# Patient Record
Sex: Female | Born: 1955 | Race: White | Hispanic: No | Marital: Married | Smoking: Never smoker
Health system: Southern US, Community
[De-identification: ages and names within clinical notes are randomized; demographics above are authoritative.]

## PROBLEM LIST (undated history)

## (undated) DIAGNOSIS — G43909 Migraine, unspecified, not intractable, without status migrainosus: Secondary | ICD-10-CM

## (undated) HISTORY — PX: ABDOMINAL HYSTERECTOMY: SHX81

## (undated) HISTORY — PX: APPENDECTOMY: SHX54

---

## 2014-01-13 ENCOUNTER — Encounter (HOSPITAL_COMMUNITY): Payer: Self-pay | Admitting: Emergency Medicine

## 2014-01-13 ENCOUNTER — Emergency Department (HOSPITAL_COMMUNITY)
Admission: EM | Admit: 2014-01-13 | Discharge: 2014-01-13 | Disposition: A | Discharge status: Home / Self Care | Payer: Managed Care, Other (non HMO) | Attending: Emergency Medicine | Admitting: Emergency Medicine

## 2014-01-13 ENCOUNTER — Emergency Department (HOSPITAL_COMMUNITY): Payer: Managed Care, Other (non HMO)

## 2014-01-13 DIAGNOSIS — X58XXXA Exposure to other specified factors, initial encounter: Secondary | ICD-10-CM | POA: Insufficient documentation

## 2014-01-13 DIAGNOSIS — Z79899 Other long term (current) drug therapy: Secondary | ICD-10-CM | POA: Insufficient documentation

## 2014-01-13 DIAGNOSIS — Y939 Activity, unspecified: Secondary | ICD-10-CM | POA: Insufficient documentation

## 2014-01-13 DIAGNOSIS — G454 Transient global amnesia: Secondary | ICD-10-CM

## 2014-01-13 DIAGNOSIS — Z880 Allergy status to penicillin: Secondary | ICD-10-CM | POA: Insufficient documentation

## 2014-01-13 DIAGNOSIS — Z791 Long term (current) use of non-steroidal anti-inflammatories (NSAID): Secondary | ICD-10-CM | POA: Insufficient documentation

## 2014-01-13 DIAGNOSIS — S39012A Strain of muscle, fascia and tendon of lower back, initial encounter: Secondary | ICD-10-CM

## 2014-01-13 DIAGNOSIS — S335XXA Sprain of ligaments of lumbar spine, initial encounter: Secondary | ICD-10-CM | POA: Insufficient documentation

## 2014-01-13 DIAGNOSIS — Y929 Unspecified place or not applicable: Secondary | ICD-10-CM | POA: Insufficient documentation

## 2014-01-13 DIAGNOSIS — Z7982 Long term (current) use of aspirin: Secondary | ICD-10-CM | POA: Insufficient documentation

## 2014-01-13 LAB — COMPREHENSIVE METABOLIC PANEL
ALT: 20 U/L (ref 0–35)
AST: 20 U/L (ref 0–37)
Albumin: 4.3 g/dL (ref 3.5–5.2)
Alkaline Phosphatase: 69 U/L (ref 39–117)
Anion gap: 15 (ref 5–15)
BUN: 11 mg/dL (ref 6–23)
CALCIUM: 9.4 mg/dL (ref 8.4–10.5)
CO2: 24 mEq/L (ref 19–32)
Chloride: 102 mEq/L (ref 96–112)
Creatinine, Ser: 0.64 mg/dL (ref 0.50–1.10)
GFR calc Af Amer: >90 mL/min (ref >90)
GFR calc non Af Amer: >90 mL/min (ref >90)
Glucose, Bld: 97 mg/dL (ref 70–99)
Potassium: 3.8 mEq/L (ref 3.7–5.3)
SODIUM: 141 meq/L (ref 137–147)
TOTAL PROTEIN: 7.6 g/dL (ref 6.0–8.3)
Total Bilirubin: 0.5 mg/dL (ref 0.3–1.2)

## 2014-01-13 LAB — RAPID URINE DRUG SCREEN, HOSP PERFORMED
Amphetamines: NOT DETECTED
Barbiturates: NOT DETECTED
Benzodiazepines: NOT DETECTED
COCAINE: NOT DETECTED
Opiates: NOT DETECTED
Tetrahydrocannabinol: NOT DETECTED

## 2014-01-13 LAB — DIFFERENTIAL
BASOS ABS: 0 10*3/uL (ref 0.0–0.1)
Basophils Relative: 0 % (ref 0–1)
EOS ABS: 0 10*3/uL (ref 0.0–0.7)
EOS PCT: 1 % (ref 0–5)
LYMPHS PCT: 20 % (ref 12–46)
Lymphs Abs: 1.8 10*3/uL (ref 0.7–4.0)
Monocytes Absolute: 0.7 10*3/uL (ref 0.1–1.0)
Monocytes Relative: 8 % (ref 3–12)
Neutro Abs: 6.2 10*3/uL (ref 1.7–7.7)
Neutrophils Relative %: 71 % (ref 43–77)

## 2014-01-13 LAB — URINALYSIS, ROUTINE W REFLEX MICROSCOPIC
BILIRUBIN URINE: NEGATIVE
GLUCOSE, UA: NEGATIVE mg/dL
Hgb urine dipstick: NEGATIVE
KETONES UR: NEGATIVE mg/dL
LEUKOCYTES UA: NEGATIVE
Nitrite: NEGATIVE
PH: 7.5 (ref 5.0–8.0)
Protein, ur: NEGATIVE mg/dL
Specific Gravity, Urine: 1.009 (ref 1.005–1.030)
Urobilinogen, UA: 0.2 mg/dL (ref 0.0–1.0)

## 2014-01-13 LAB — I-STAT TROPONIN, ED: Troponin i, poc: 0 ng/mL (ref 0.00–0.08)

## 2014-01-13 LAB — CBC
HCT: 40.2 % (ref 36.0–46.0)
Hemoglobin: 13.8 g/dL (ref 12.0–15.0)
MCH: 31.2 pg (ref 26.0–34.0)
MCHC: 34.3 g/dL (ref 30.0–36.0)
MCV: 90.7 fL (ref 78.0–100.0)
PLATELETS: 253 10*3/uL (ref 150–400)
RBC: 4.43 MIL/uL (ref 3.87–5.11)
RDW: 13.2 % (ref 11.5–15.5)
WBC: 8.8 10*3/uL (ref 4.0–10.5)

## 2014-01-13 LAB — ETHANOL

## 2014-01-13 MED ORDER — METHOCARBAMOL 500 MG PO TABS: 500.0000 mg | ORAL_TABLET | Freq: Two times a day (BID) | ORAL | Status: AC

## 2014-01-13 MED ORDER — MORPHINE SULFATE 4 MG/ML IJ SOLN
4.0000 mg | INTRAMUSCULAR | Status: DC | PRN
  Administered 2014-01-13: 4 mg via INTRAVENOUS
  Filled 2014-01-13: qty 1

## 2014-01-13 MED ORDER — HYDROCODONE-ACETAMINOPHEN 5-325 MG PO TABS: 1.0000 | ORAL_TABLET | ORAL | Status: AC | PRN

## 2014-01-13 MED ORDER — PROMETHAZINE HCL 25 MG PO TABS: 25.0000 mg | ORAL_TABLET | Freq: Four times a day (QID) | ORAL | Status: AC | PRN

## 2014-01-13 MED ORDER — NAPROXEN 500 MG PO TABS: 500.0000 mg | ORAL_TABLET | Freq: Two times a day (BID) | ORAL | Status: AC

## 2014-01-13 MED ORDER — METHOCARBAMOL 1000 MG/10ML IJ SOLN
1000.0000 mg | Freq: Once | INTRAVENOUS | Status: AC
  Administered 2014-01-13: 1000 mg via INTRAVENOUS
  Filled 2014-01-13: qty 10

## 2014-01-13 MED ORDER — ONDANSETRON HCL 4 MG/2ML IJ SOLN
4.0000 mg | Freq: Once | INTRAMUSCULAR | Status: AC
  Administered 2014-01-13: 4 mg via INTRAVENOUS
  Filled 2014-01-13: qty 2

## 2014-01-13 MED ORDER — DIAZEPAM 5 MG/ML IJ SOLN
5.0000 mg | Freq: Once | INTRAMUSCULAR | Status: DC | PRN
Start: 2014-01-13 — End: 2014-01-13

## 2014-01-13 NOTE — Discharge Instructions (Signed)
Back Pain, Adult Back pain is very common. The pain often gets better over time. The cause of back pain is usually not dangerous. Most people can learn to manage their back pain on their own.  HOME CARE   Stay active. Start with short walks on flat ground if you can. Try to walk farther each day.  Do not sit, drive, or stand in one place for more than 30 minutes. Do not stay in bed.  Do not avoid exercise or work. Activity can help your back heal faster.  Be careful when you bend or lift an object. Bend at your knees, keep the object close to you, and do not twist.  Sleep on a firm mattress. Lie on your side, and bend your knees. If you lie on your back, put a pillow under your knees.  Only take medicines as told by your doctor.  Put ice on the injured area.  Put ice in a plastic bag.  Place a towel between your skin and the bag.  Leave the ice on for 15-20 minutes, 03-04 times a day for the first 2 to 3 days. After that, you can switch between ice and heat packs.  Ask your doctor about back exercises or massage.  Avoid feeling anxious or stressed. Find good ways to deal with stress, such as exercise. GET HELP RIGHT AWAY IF:   Your pain does not go away with rest or medicine.  Your pain does not go away in 1 week.  You have new problems.  You do not feel well.  The pain spreads into your legs.  You cannot control when you poop (bowel movement) or pee (urinate).  Your arms or legs feel weak or lose feeling (numbness).  You feel sick to your stomach (nauseous) or throw up (vomit).  You have belly (abdominal) pain.  You feel like you may pass out (faint). MAKE SURE YOU:   Understand these instructions.  Will watch your condition.  Will get help right away if you are not doing well or get worse. Document Released: 12/07/2007 Document Revised: 09/12/2011 Document Reviewed: 11/08/2010 [facility] Patient Information 2015 [place], [place]. This information is not intended  to replace advice given to you by your health care provider. Make sure you discuss any questions you have with your health care provider.  Lumbosacral Strain Lumbosacral strain is a strain of any of the parts that make up your lumbosacral vertebrae. Your lumbosacral vertebrae are the bones that make up the lower third of your backbone. Your lumbosacral vertebrae are held together by muscles and tough, fibrous tissue (ligaments).  CAUSES  A sudden blow to your back can cause lumbosacral strain. Also, anything that causes an excessive stretch of the muscles in the low back can cause this strain. This is typically seen when people exert themselves strenuously, fall, lift heavy objects, bend, or crouch repeatedly. RISK FACTORS  Physically demanding work.  Participation in pushing or pulling sports or sports that require a sudden twist of the back (tennis, golf, baseball).  Weight lifting.  Excessive lower back curvature.  Forward-tilted pelvis.  Weak back or abdominal muscles or both.  Tight hamstrings. SIGNS AND SYMPTOMS  Lumbosacral strain may cause pain in the area of your injury or pain that moves (radiates) down your leg.  DIAGNOSIS Your health care provider can often diagnose lumbosacral strain through a physical exam. In some cases, you may need tests such as X-ray exams.  TREATMENT  Treatment for your lower back injury depends  on many factors that your clinician will have to evaluate. However, most treatment will include the use of anti-inflammatory medicines. HOME CARE INSTRUCTIONS   Avoid hard physical activities (tennis, racquetball, waterskiing) if you are not in proper physical condition for it. This may aggravate or create problems.  If you have a back problem, avoid sports requiring sudden body movements. Swimming and walking are generally safer activities.  Maintain good posture.  Maintain a healthy weight.  For acute conditions, you may put ice on the injured  area.  Put ice in a plastic bag.  Place a towel between your skin and the bag.  Leave the ice on for 20 minutes, 2-3 times a day.  When the low back starts healing, stretching and strengthening exercises may be recommended. SEEK MEDICAL CARE IF:  Your back pain is getting worse.  You experience severe back pain not relieved with medicines. SEEK IMMEDIATE MEDICAL CARE IF:   You have numbness, tingling, weakness, or problems with the use of your arms or legs.  There is a change in bowel or bladder control.  You have increasing pain in any area of the body, including your belly (abdomen).  You notice shortness of breath, dizziness, or feel faint.  You feel sick to your stomach (nauseous), are throwing up (vomiting), or become sweaty.  You notice discoloration of your toes or legs, or your feet get very cold. MAKE SURE YOU:   Understand these instructions.  Will watch your condition.  Will get help right away if you are not doing well or get worse. Document Released: 03/30/2005 Document Revised: 06/25/2013 Document Reviewed: 02/06/2013 [facility] Patient Information 2015 [place], [place]. This information is not intended to replace advice given to you by your health care provider. Make sure you discuss any questions you have with your health care provider.  Transient Global Amnesia Your exam shows you may have a rare problem that causes temporary amnesia, an inability to remember what has happened in the past several hours or day. Transient global amnesia (TGA) means you cannot remember recent events, even though you may look and act normally. There are no physical problems in TGA; your vision, strength, coordination, and sensations are all normal. TGA occurs most often in older patients, and in patients with high blood pressure. The exact cause of TGA is not known, although it is thought to be due to vascular disease in your brain. There is usually a complete return to normal memory  capacity after an episode is over. About 20-30% of patients with TGA will have more than one episode, and some studies show a slight increased risk for stroke. Although no special treatment is needed, taking up to one adult aspirin daily reduces the risk of having a stroke. You should consider taking aspirin daily if you are not allergic to it. Medical evaluation may require specialized scans to check for stroke or other brain problems, an EEG (brain wave test), or blood tests. Avoid alcohol or any sedating medicines until you are completely recovered. Call your doctor right away if your memory is not fully recovered after 24 hours, or if you have any other serious problems including:  Severe headache, nausea, vomiting, fever, or other symptoms of an infection.  Weakness, numbness, difficulty with movement, or incoordination.  Blurred or double vision, unusual sleepiness, seizures, or fainting. Document Released: 07/28/2004 Document Revised: 09/12/2011 Document Reviewed: 06/20/2005 [facility] Patient Information 2015 [place], [place]. This information is not intended to replace advice given to you by your health  care provider. Make sure you discuss any questions you have with your health care provider.

## 2014-01-13 NOTE — ED Notes (Signed)
Ambulated to bathroom with minimal assistance.

## 2014-01-13 NOTE — ED Provider Notes (Signed)
CSN: [id number]     Arrival date & time 01/13/14  1133 History   First MD Initiated Contact with Patient 01/13/14 1238     Chief Complaint  Patient presents with  . Back Pain  . Memory Loss      HPI  Patient presents after an episode of memory loss. She states her last reported her back has been bothering her period yesterday she went to the general of the bike. Upon returning home she has a significant lower back muscle spasms without leg symptoms. She presented to urgent care this morning. He was given Toradol IM. Started to feel nausea. Was discharged. Upon getting in the car started having difficulty with memory and cannot recall yesterday or morning this morning. Denies any traumatic events. Did not have vagal episode or significant difficulty with the IM injection. No stroke symptoms i.e. no weakness or numbness to the extremities. Is perseverating.  History reviewed. No pertinent past medical history. Past Surgical History  Procedure Laterality Date  . Abdominal hysterectomy    . Appendectomy     History reviewed. No pertinent family history. History  Substance Use Topics  . Smoking status: Never Smoker   . Smokeless tobacco: Not on file  . Alcohol Use: 0.6 oz/week    1 Glasses of wine per week     Comment: 1 glass a week   OB History   Grav Para Term Preterm Abortions TAB SAB Ect Mult Living                 Review of Systems  Constitutional: Negative for fever, chills, diaphoresis, appetite change and fatigue.  HENT: Negative for mouth sores, sore throat and trouble swallowing.   Eyes: Negative for visual disturbance.  Respiratory: Negative for cough, chest tightness, shortness of breath and wheezing.   Cardiovascular: Negative for chest pain.  Gastrointestinal: Negative for nausea, vomiting, abdominal pain, diarrhea and abdominal distention.  Endocrine: Negative for polydipsia, polyphagia and polyuria.  Genitourinary: Negative for dysuria, frequency and hematuria.   Musculoskeletal: Positive for back pain and myalgias. Negative for gait problem.  Skin: Negative for color change, pallor and rash.  Neurological: Negative for dizziness, syncope, light-headedness and headaches.       Amnesia for the morning events.  Hematological: Does not bruise/bleed easily.  Psychiatric/Behavioral: Negative for behavioral problems and confusion.      Allergies  Bactrim and Penicillins  Home Medications   Prior to Admission medications   Medication Sig Start Date End Date Taking? Authorizing Provider  aspirin 325 MG tablet Take 325 mg by mouth daily as needed for mild pain or headache.   Yes Historical Provider, MD  Estradiol (VAGIFEM) 10 MCG TABS vaginal tablet Place 1 tablet vaginally 3 (three) times a week.   Yes Historical Provider, MD  estradiol (VIVELLE-DOT) 0.025 MG/24HR Place 1 patch onto the skin 2 (two) times a week.  11/15/13  Yes Historical Provider, MD  naproxen sodium (ANAPROX) 220 MG tablet Take 440 mg by mouth daily as needed (for pain).   Yes Historical Provider, MD  HYDROcodone-acetaminophen (NORCO/VICODIN) 5-325 MG per tablet Take 1 tablet by mouth every 4 (four) hours as needed. 01/13/14   Rolland PorterMark Gustie Bobb, MD  methocarbamol (ROBAXIN) 500 MG tablet Take 1 tablet (500 mg total) by mouth 2 (two) times daily. 01/13/14   Rolland PorterMark Caspar Favila, MD  naproxen (NAPROSYN) 500 MG tablet Take 1 tablet (500 mg total) by mouth 2 (two) times daily. 01/13/14   Rolland PorterMark Eara Burruel, MD  promethazine (PHENERGAN) 25  MG tablet Take 1 tablet (25 mg total) by mouth every 6 (six) hours as needed for nausea. 01/13/14   Ankit Rhunette Croft, MD   BP 143/81  Pulse 68  Temp(Src) 98 F (36.7 C) (Oral)  Resp 12  Ht 5\' 6"  (1.676 m)  Wt 160 lb 4.8 oz (72.712 kg)  BMI 25.89 kg/m2  SpO2 98% Physical Exam  Constitutional: She is oriented to person, place, and time. She appears well-developed and well-nourished. No distress.  HENT:  Head: Normocephalic.  Eyes: Conjunctivae are normal. Pupils are equal,  round, and reactive to light. No scleral icterus.  Neck: Normal range of motion. Neck supple. No thyromegaly present.  Cardiovascular: Normal rate and regular rhythm.  Exam reveals no gallop and no friction rub.   No murmur heard. Pulmonary/Chest: Effort normal and breath sounds normal. No respiratory distress. She has no wheezes. She has no rales.  Abdominal: Soft. Bowel sounds are normal. She exhibits no distension. There is no tenderness. There is no rebound.  Musculoskeletal: Normal range of motion.  Tenderness in the musculature of the midline lower lumbar spine.  Neurological: She is alert and oriented to person, place, and time.  Amnestic for yesterday at the gym this morning at the urgent care center and for her transport here by husband. Does remember her name, address, her phone number, her birth date and place.  Normal symmetric Strength to shoulder shrug, triceps, biceps, grip,wrist flex/extend,and intrinsics  Norma lsymmetric sensation above and below clavicles, and to all distributions to UEs. Norma symmetric strength to flex/.extend hip and knees, dorsi/plantar flex ankles. Normal symmetric sensation to all distributions to LEs Patellar and achilles reflexes 1-2+. Downgoing Babinski   Skin: Skin is warm and dry. No rash noted.  Psychiatric: She has a normal mood and affect. Her behavior is normal.    ED Course  Procedures (including critical care time) Labs Review Labs Reviewed  CBC  DIFFERENTIAL  COMPREHENSIVE METABOLIC PANEL  ETHANOL  URINE RAPID DRUG SCREEN (HOSP PERFORMED)  URINALYSIS, ROUTINE W REFLEX MICROSCOPIC  CBG MONITORING, ED  I-STAT TROPOININ, ED    Imaging Review No results found.   EKG Interpretation   Date/Time:  Monday January 13 2014 12:17:14 EDT Ventricular Rate:  79 PR Interval:  154 QRS Duration: 68 QT Interval:  362 QTC Calculation: 415 R Axis:   76 Text Interpretation:  Normal sinus rhythm Septal infarct , age  undetermined Abnormal  ECG Confirmed by Rhunette Croft, MD, Janey Genta [id number]) on  01/13/2014 5:51:55 PM      MDM   Final diagnoses:  Transient global amnesia  Lumbar strain, initial encounter    Patient discussed with neurology. My working diagnosis is an episode of transient global amnesia. Unrelated to her Toradol. Unrelated to her back pain. Did not have an emotional event. On recheck she is starting to have some recall for memory. MRI is still pending. If her MRI is normal I think is appropriate for outpatient treatment. Neurological followup for primary care followup as needed. She is neurologically intact and without leg symptoms regarding her back pain. Plan will be symptomatic treatment with pain medicine, muscle relaxants, anti-inflammatories.    Rolland Porter, MD 01/20/14 [phone]

## 2014-01-13 NOTE — ED Notes (Signed)
Pt returned from MRI °

## 2014-01-13 NOTE — ED Provider Notes (Addendum)
  Physical Exam  BP 137/77  Pulse 73  Temp(Src) 98 F (36.7 C) (Oral)  Resp 15  Ht 5\' 6"  (1.676 m)  Wt 160 lb 4.8 oz (72.712 kg)  BMI 25.89 kg/m2  SpO2 100%  Physical Exam  ED Course  Procedures  MDM  Healthy patient comes in with back pain, and memory loss.  Pt with TIA. CC - back pain with amnesia today. No red flags of significant etiology for back pain. Neuro recs - MRI, if normal d/c.  Derwood KaplanAnkit Olena Willy, MD 01/13/14 1617  5:58 PM  MRI shows no acute findings. Results discussed with Dr. Amada JupiterKirkpatrick, who reviewed the MRI on his own. Has lumbar spine tenderness, with normal neuro exam and strength. Ambulating ok to the bathroom. Will discharge. Will email Dr. Darrell JewelPannosh to see is she can see patient sooner than September for likely a TIA workup.   Derwood KaplanAnkit Gennett Garcia, MD 01/13/14 [phone]

## 2014-01-13 NOTE — ED Notes (Signed)
Pt is no longer confused-- answers all questions appropriately. Remains unsure of events at urgent care.

## 2014-01-13 NOTE — ED Notes (Addendum)
[facility] No recent injury. Pt states was given Toradol IM at 1000 this morning and states now she can't remember anything since getting shot. Pt has clear speech, AO x 4, Neuro, NAD. Pt oriented to self, situation and place but  When asked month states "I just don't really know right now."  States "I can't remember anything that happened today or anything at all."

## 2014-01-16 ENCOUNTER — Telehealth: Payer: Self-pay | Admitting: Family Medicine

## 2014-01-16 NOTE — Telephone Encounter (Signed)
Called the pt to make appt.  She stated that she has found another physician to see who is closer to her home.  No longer needs an appt with [facility].  Wished her well and asked that she call back if there is a change of heart.  Will cancel her upcoming appt in Sept.

## 2014-01-16 NOTE — Telephone Encounter (Signed)
Message copied by Nils FlackADKINS, MISTY T on Thu Jan 16, 2014  2:15 PM ------      Message from: Derwood KaplanNANAVATI, ANKIT      Created: Mon Jan 13, 2014  6:09 PM       Hi Dr. Fabian SharpPanosh,            Patient Shawna Young has an appointment, new patient, set up with you for September. She is healthy, and had an amnesic event today. She has no risk factors for strokes. Her memoty has improved. MRI is normal, and neurology wants outpatient TIA workup.            Will you be either able to 1. See her sooner than her September date, or 2 order outpatient workup for her prior to her coming to see you.            Thanks.            Derwood KaplanAnkit Nanavati, MD      Emergency Medicine ------

## 2014-04-02 ENCOUNTER — Ambulatory Visit: Payer: Self-pay | Admitting: Internal Medicine

## 2014-10-24 ENCOUNTER — Encounter (HOSPITAL_COMMUNITY): Payer: Self-pay | Admitting: Emergency Medicine

## 2014-10-24 ENCOUNTER — Emergency Department (HOSPITAL_COMMUNITY)
Admission: EM | Admit: 2014-10-24 | Discharge: 2014-10-24 | Disposition: A | Discharge status: Home / Self Care | Payer: Managed Care, Other (non HMO) | Attending: Emergency Medicine | Admitting: Emergency Medicine

## 2014-10-24 ENCOUNTER — Emergency Department (HOSPITAL_COMMUNITY): Payer: Managed Care, Other (non HMO)

## 2014-10-24 DIAGNOSIS — Z880 Allergy status to penicillin: Secondary | ICD-10-CM | POA: Diagnosis not present

## 2014-10-24 DIAGNOSIS — R079 Chest pain, unspecified: Secondary | ICD-10-CM | POA: Diagnosis not present

## 2014-10-24 DIAGNOSIS — Z7952 Long term (current) use of systemic steroids: Secondary | ICD-10-CM | POA: Diagnosis not present

## 2014-10-24 DIAGNOSIS — Z79899 Other long term (current) drug therapy: Secondary | ICD-10-CM | POA: Insufficient documentation

## 2014-10-24 DIAGNOSIS — R0602 Shortness of breath: Secondary | ICD-10-CM | POA: Diagnosis not present

## 2014-10-24 LAB — CBC
HCT: 42 % (ref 36.0–46.0)
Hemoglobin: 14 g/dL (ref 12.0–15.0)
MCH: 30.9 pg (ref 26.0–34.0)
MCHC: 33.3 g/dL (ref 30.0–36.0)
MCV: 92.7 fL (ref 78.0–100.0)
PLATELETS: 182 10*3/uL (ref 150–400)
RBC: 4.53 MIL/uL (ref 3.87–5.11)
RDW: 13 % (ref 11.5–15.5)
WBC: 5.5 10*3/uL (ref 4.0–10.5)

## 2014-10-24 LAB — BASIC METABOLIC PANEL
Anion gap: 5 (ref 5–15)
BUN: 15 mg/dL (ref 6–23)
CALCIUM: 9.4 mg/dL (ref 8.4–10.5)
CO2: 25 mmol/L (ref 19–32)
Chloride: 108 mmol/L (ref 96–112)
Creatinine, Ser: 0.68 mg/dL (ref 0.50–1.10)
GFR calc Af Amer: >90 mL/min (ref >90)
GLUCOSE: 90 mg/dL (ref 70–99)
Potassium: 3.8 mmol/L (ref 3.5–5.1)
SODIUM: 138 mmol/L (ref 135–145)

## 2014-10-24 LAB — TROPONIN I: Troponin I: <0.03 ng/mL (ref <0.031)

## 2014-10-24 LAB — I-STAT TROPONIN, ED: Troponin i, poc: 0 ng/mL (ref 0.00–0.08)

## 2014-10-24 MED ORDER — FAMOTIDINE 20 MG PO TABS: 20.0000 mg | ORAL_TABLET | Freq: Two times a day (BID) | ORAL | Status: DC

## 2014-10-24 NOTE — ED Provider Notes (Signed)
CSN: [id number]     Arrival date & time 10/24/14  1219 History   First MD Initiated Contact with Patient 10/24/14 1349     Chief Complaint  Patient presents with  . Chest Pain  . Dizziness  . Numbness    right, facial     (Consider location/radiation/quality/duration/timing/severity/associated sxs/prior Treatment) HPI Comments: Patient presents with chest pain. She was at her hairdresser sitting in a chair and started having a achy discomfort in the center of her chest. It radiated to her jaw. Also radiates to her epigastrium. She had a little bit of nausea and a little bit of lightheadedness. There is no diaphoresis. She felt that she couldn't get a full breath but didn't have other shortness of breath. She felt like it was a little bit hard to swallow. She states the pain lasted about 2 hours. She did not feel like it was any worse when she was walking around or exerting herself. She denies any leg pain or swelling. She denies any cough or chest congestion. He's been pain-free since arrival to the ED. She denies any past history of chest pain or cardiac disease. She denies any history of hypertension hyperlipidemia diabetes or tobacco use. She denies any family history of heart disease. There is no history of recent long trips or surgeries. She denies any estrogen use. There is no history of blood clots.  Patient is a 59 y.o. female presenting with chest pain and dizziness.  Chest Pain Associated symptoms: dizziness and shortness of breath   Associated symptoms: no abdominal pain, no back pain, no cough, no diaphoresis, no fatigue, no fever, no headache, no nausea, no numbness, not vomiting and no weakness   Dizziness Associated symptoms: chest pain and shortness of breath   Associated symptoms: no blood in stool, no diarrhea, no headaches, no nausea, no vomiting and no weakness     History reviewed. No pertinent past medical history. Past Surgical History  Procedure Laterality Date  .  Abdominal hysterectomy    . Appendectomy     History reviewed. No pertinent family history. History  Substance Use Topics  . Smoking status: Never Smoker   . Smokeless tobacco: Not on file  . Alcohol Use: 0.6 oz/week    1 Glasses of wine per week     Comment: 1 glass a week   OB History    No data available     Review of Systems  Constitutional: Negative for fever, chills, diaphoresis and fatigue.  HENT: Negative for congestion, rhinorrhea and sneezing.   Eyes: Negative.   Respiratory: Positive for shortness of breath. Negative for cough and chest tightness.   Cardiovascular: Positive for chest pain. Negative for leg swelling.  Gastrointestinal: Negative for nausea, vomiting, abdominal pain, diarrhea and blood in stool.  Genitourinary: Negative for frequency, hematuria, flank pain and difficulty urinating.  Musculoskeletal: Negative for back pain and arthralgias.  Skin: Negative for rash.  Neurological: Positive for dizziness. Negative for speech difficulty, weakness, numbness and headaches.      Allergies  Toradol; Bactrim; and Penicillins  Home Medications   Prior to Admission medications   Medication Sig Start Date End Date Taking? Authorizing Provider  aspirin 325 MG tablet Take 325 mg by mouth daily as needed for mild pain or headache.   Yes Historical Provider, MD  buPROPion (WELLBUTRIN XL) 300 MG 24 hr tablet Take 300 mg by mouth every morning. 08/15/14 08/15/15 Yes Historical Provider, MD  cyclobenzaprine (FLEXERIL) 5 MG tablet Take 5 mg  by mouth 3 (three) times daily as needed. For migraines   Yes Historical Provider, MD  Estradiol (VAGIFEM) 10 MCG TABS vaginal tablet Place 1 tablet vaginally 2 (two) times a week.    Yes Historical Provider, MD  fluticasone (FLONASE) 50 MCG/ACT nasal spray Place 1 spray into the nose daily as needed. allergies 02/18/14 02/18/15 Yes Historical Provider, MD  naproxen (NAPROSYN) 500 MG tablet Take 1 tablet (500 mg total) by mouth 2 (two)  times daily. Patient taking differently: Take 500 mg by mouth 2 (two) times daily as needed for mild pain or moderate pain.  01/13/14  Yes Rolland Porter, MD  promethazine (PHENERGAN) 25 MG tablet Take 1 tablet (25 mg total) by mouth every 6 (six) hours as needed for nausea. 01/13/14  Yes Derwood Kaplan, MD  famotidine (PEPCID) 20 MG tablet Take 1 tablet (20 mg total) by mouth 2 (two) times daily. 10/24/14   Rolan Bucco, MD  HYDROcodone-acetaminophen (NORCO/VICODIN) 5-325 MG per tablet Take 1 tablet by mouth every 4 (four) hours as needed. Patient not taking: Reported on 10/24/2014 01/13/14   Rolland Porter, MD  methocarbamol (ROBAXIN) 500 MG tablet Take 1 tablet (500 mg total) by mouth 2 (two) times daily. Patient not taking: Reported on 10/24/2014 01/13/14   Rolland Porter, MD   BP 137/78 mmHg  Pulse 68  Temp(Src) 97.8 F (36.6 C) (Oral)  Resp 15  SpO2 100% Physical Exam  Constitutional: She is oriented to person, place, and time. She appears well-developed and well-nourished.  HENT:  Head: Normocephalic and atraumatic.  Eyes: Pupils are equal, round, and reactive to light.  Neck: Normal range of motion. Neck supple.  Cardiovascular: Normal rate, regular rhythm and normal heart sounds.   Pulmonary/Chest: Effort normal and breath sounds normal. No respiratory distress. She has no wheezes. She has no rales. She exhibits no tenderness.  Abdominal: Soft. Bowel sounds are normal. There is no tenderness. There is no rebound and no guarding.  Musculoskeletal: Normal range of motion. She exhibits no edema.  No edema or calf tenderness  Lymphadenopathy:    She has no cervical adenopathy.  Neurological: She is alert and oriented to person, place, and time.  Skin: Skin is warm and dry. No rash noted.  Psychiatric: She has a normal mood and affect.    ED Course  Procedures (including critical care time) Labs Review Labs Reviewed  CBC  BASIC METABOLIC PANEL  TROPONIN I  Rosezena Sensor, ED    Imaging  Review Dg Chest Port 1 View  10/24/2014   CLINICAL DATA:  Substernal chest pain.  Symptom onset earlier today.  EXAM: PORTABLE CHEST - 1 VIEW  COMPARISON:  None.  FINDINGS: The heart size and mediastinal contours are within normal limits. Both lungs are clear. The visualized skeletal structures are unremarkable.  IMPRESSION: No active disease.   Electronically Signed   By: Davonna Belling M.D.   On: 10/24/2014 13:44     EKG Interpretation   Date/Time:  Friday October 24 2014 14:28:02 EDT Ventricular Rate:  68 PR Interval:  173 QRS Duration: 71 QT Interval:  382 QTC Calculation: 406 R Axis:   64 Text Interpretation:  Sinus rhythm Probable anteroseptal infarct, old  since last tracing no significant change Confirmed by Dalin Caldera  MD, Athenia Rys  ([id number]) on 10/24/2014 2:34:36 PM      MDM   Final diagnoses:  Chest pain, unspecified chest pain type    Patient presents with chest discomfort with associated shortness of breath and dizziness.  She's been chest pain-free since arrival to the ED. Her EKG does not show any ischemic changes. Her troponin is negative. She has a heart score of 2. This is in the low risk category. I will check a second troponin. If this is negative, she can be discharged home with close follow-up with her primary care physician. She states that she does have a primary care physician and feels that she can follow-up on Monday or Tuesday. She is amenable to returning to the ED if she has any worsening symptoms over the weekend. I stressed that if she has any exertional symptoms she needs to return to the ED.  Dr. Freida BusmanAllen to d/c if 2nd troponin negative    Rolan BuccoMelanie Alichia Alridge, MD 10/24/14 [phone]

## 2014-10-24 NOTE — Discharge Instructions (Signed)

## 2014-10-24 NOTE — ED Notes (Signed)
Called lab regarding delay in troponin results. Lab sts "Troponin is running now."

## 2014-10-24 NOTE — ED Notes (Signed)
Pt c/o mid central chest pain radiating to right face (tingling), pain described as "pressure." Denies SOB/F/D. Endorses nausea. No hx CVA or MI. Has not started/ended any new medications. Denies hx anxiety. Denies family cardiac history and denies cholesterol problems. Pain started approximately 30 minutes ago when she was at hair salon.

## 2014-10-24 NOTE — ED Notes (Signed)
Bed: [id number] Expected date:  Expected time:  Means of arrival:  Comments: Hold- Triage 1

## 2014-10-24 NOTE — ED Provider Notes (Signed)
Repeat troponin negative. Pt to be d/c per instructions of dr. Jake Sharkbelfi  Shawna Rabinovich, MD 10/24/14 [phone]

## 2014-10-24 NOTE — Progress Notes (Signed)
Updated pcp in EPIC  Updated EDP that pt wanting results of labs

## 2014-10-24 NOTE — ED Notes (Signed)
Pt escorted to discharge window. Pt verbalized understanding discharge instructions. In no acute distress.  

## 2014-10-24 NOTE — ED Notes (Signed)
Nurse currently starting IV 

## 2014-12-03 ENCOUNTER — Emergency Department (HOSPITAL_COMMUNITY)
Admission: EM | Admit: 2014-12-03 | Discharge: 2014-12-03 | Disposition: A | Discharge status: Home / Self Care | Payer: Managed Care, Other (non HMO) | Attending: Emergency Medicine | Admitting: Emergency Medicine

## 2014-12-03 ENCOUNTER — Encounter (HOSPITAL_COMMUNITY): Payer: Self-pay | Admitting: Emergency Medicine

## 2014-12-03 DIAGNOSIS — R51 Headache: Secondary | ICD-10-CM

## 2014-12-03 DIAGNOSIS — G43909 Migraine, unspecified, not intractable, without status migrainosus: Secondary | ICD-10-CM | POA: Insufficient documentation

## 2014-12-03 DIAGNOSIS — Z880 Allergy status to penicillin: Secondary | ICD-10-CM | POA: Diagnosis not present

## 2014-12-03 DIAGNOSIS — Z7982 Long term (current) use of aspirin: Secondary | ICD-10-CM | POA: Diagnosis not present

## 2014-12-03 DIAGNOSIS — Z79899 Other long term (current) drug therapy: Secondary | ICD-10-CM | POA: Diagnosis not present

## 2014-12-03 DIAGNOSIS — Z7951 Long term (current) use of inhaled steroids: Secondary | ICD-10-CM | POA: Diagnosis not present

## 2014-12-03 DIAGNOSIS — R519 Headache, unspecified: Secondary | ICD-10-CM

## 2014-12-03 HISTORY — DX: Migraine, unspecified, not intractable, without status migrainosus: G43.909

## 2014-12-03 MED ORDER — DIPHENHYDRAMINE HCL 50 MG/ML IJ SOLN
25.0000 mg | Freq: Once | INTRAMUSCULAR | Status: AC
  Administered 2014-12-03: 25 mg via INTRAVENOUS
  Filled 2014-12-03: qty 1

## 2014-12-03 MED ORDER — DEXAMETHASONE SODIUM PHOSPHATE 4 MG/ML IJ SOLN
8.0000 mg | Freq: Once | INTRAMUSCULAR | Status: AC
  Administered 2014-12-03: 8 mg via INTRAVENOUS
  Filled 2014-12-03: qty 2

## 2014-12-03 MED ORDER — SODIUM CHLORIDE 0.9 % IV BOLUS (SEPSIS)
1000.0000 mL | Freq: Once | INTRAVENOUS | Status: AC
  Administered 2014-12-03: 1000 mL via INTRAVENOUS

## 2014-12-03 MED ORDER — SUMATRIPTAN SUCCINATE 50 MG PO TABS: 50.0000 mg | ORAL_TABLET | ORAL | Status: AC | PRN

## 2014-12-03 MED ORDER — PROCHLORPERAZINE EDISYLATE 5 MG/ML IJ SOLN
10.0000 mg | Freq: Once | INTRAMUSCULAR | Status: AC
  Administered 2014-12-03: 10 mg via INTRAVENOUS
  Filled 2014-12-03: qty 2

## 2014-12-03 NOTE — ED Provider Notes (Signed)
CSN: [id number]     Arrival date & time 12/03/14  4098 History   First MD Initiated Contact with Patient 12/03/14 [phone]     Chief Complaint  Patient presents with  . Migraine    hx of same, home meds not working     (Consider location/radiation/quality/duration/timing/severity/associated sxs/prior Treatment) HPI  58yF with headache. Hx of migraines and says this feels similar. Onset yesterday. Associated with n/v and photophobia which is typical. No fever or chills. Denies neck pain or stiffness. No trauma. No acute visual complaints. No significant relief with home meds.   Past Medical History  Diagnosis Date  . Migraine    Past Surgical History  Procedure Laterality Date  . Abdominal hysterectomy    . Appendectomy     No family history on file. History  Substance Use Topics  . Smoking status: Never Smoker   . Smokeless tobacco: Not on file  . Alcohol Use: 0.6 oz/week    1 Glasses of wine per week     Comment: 1 glass a week   OB History    No data available     Review of Systems  All systems reviewed and negative, other than as noted in HPI.   Allergies  Toradol; Bactrim; and Penicillins  Home Medications   Prior to Admission medications   Medication Sig Start Date End Date Taking? Authorizing Provider  aspirin 325 MG tablet Take 325 mg by mouth daily as needed for mild pain or headache.   Yes Historical Provider, MD  buPROPion (WELLBUTRIN XL) 300 MG 24 hr tablet Take 300 mg by mouth every morning. 08/15/14 08/15/15 Yes Historical Provider, MD  cyclobenzaprine (FLEXERIL) 5 MG tablet Take 5 mg by mouth 3 (three) times daily as needed. For migraines   Yes Historical Provider, MD  Estradiol (VAGIFEM) 10 MCG TABS vaginal tablet Place 1 tablet vaginally 2 (two) times a week.    Yes Historical Provider, MD  fluticasone (FLONASE) 50 MCG/ACT nasal spray Place 1 spray into the nose daily as needed. allergies 02/18/14 02/18/15 Yes Historical Provider, MD  Multiple  Vitamins-Minerals (MULTIVITAMIN GUMMIES ADULT PO) Take 1 each by mouth daily.   Yes Historical Provider, MD  ondansetron (ZOFRAN) 4 MG tablet Take 4 mg by mouth every 6 (six) hours as needed for nausea or vomiting.   Yes Historical Provider, MD  famotidine (PEPCID) 20 MG tablet Take 1 tablet (20 mg total) by mouth 2 (two) times daily. Patient not taking: Reported on 12/03/2014 10/24/14   Rolan Bucco, MD  HYDROcodone-acetaminophen (NORCO/VICODIN) 5-325 MG per tablet Take 1 tablet by mouth every 4 (four) hours as needed. Patient not taking: Reported on 10/24/2014 01/13/14   Rolland Porter, MD  methocarbamol (ROBAXIN) 500 MG tablet Take 1 tablet (500 mg total) by mouth 2 (two) times daily. Patient not taking: Reported on 10/24/2014 01/13/14   Rolland Porter, MD  naproxen (NAPROSYN) 500 MG tablet Take 1 tablet (500 mg total) by mouth 2 (two) times daily. Patient not taking: Reported on 12/03/2014 01/13/14   Rolland Porter, MD  promethazine (PHENERGAN) 25 MG tablet Take 1 tablet (25 mg total) by mouth every 6 (six) hours as needed for nausea. Patient not taking: Reported on 12/03/2014 01/13/14   Derwood Kaplan, MD   BP 121/82 mmHg  Pulse 75  Temp(Src) 98.2 F (36.8 C) (Oral)  SpO2 100% Physical Exam  Constitutional: She is oriented to person, place, and time. She appears well-developed and well-nourished. No distress.  HENT:  Head: Normocephalic and  atraumatic.  Eyes: Conjunctivae are normal. Right eye exhibits no discharge. Left eye exhibits no discharge.  Neck: Neck supple.  No nuchal rigidity  Cardiovascular: Normal rate, regular rhythm and normal heart sounds.  Exam reveals no gallop and no friction rub.   No murmur heard. Pulmonary/Chest: Effort normal and breath sounds normal. No respiratory distress.  Abdominal: Soft. She exhibits no distension. There is no tenderness.  Musculoskeletal: She exhibits no edema or tenderness.  Neurological: She is alert and oriented to person, place, and time. No cranial nerve  deficit. She exhibits normal muscle tone. Coordination normal.  Good finger to nose b/l  Skin: Skin is warm and dry.  Psychiatric: She has a normal mood and affect. Her behavior is normal. Thought content normal.  Nursing note and vitals reviewed.   ED Course  Procedures (including critical care time) Labs Review Labs Reviewed - No data to display  Imaging Review No results found.   EKG Interpretation None      MDM   Final diagnoses:  Acute nonintractable headache, unspecified headache type    58yF with headache. Suspect primary HA. Consider emergent secondary causes such as bleed, infectious or mass but doubt. There is no history of trauma. Pt has a nonfocal neurological exam. Afebrile and neck supple. No use of blood thinning medication. Consider ocular etiology such as acute angle closure glaucoma but doubt. Pt denies acute change in visual acuity and eye exam unremarkable. Doubt temporal arteritis. No temporal tenderness and temporal artery pulsations palpable. Doubt CO poisoning. No contacts with similar symptoms. Doubt venous thrombosis. Doubt carotid or vertebral arteries dissection. Symptoms improved with meds. Feel that can be safely discharged, but strict return precautions discussed. Outpt fu.     Raeford RazorStephen Tex Conroy, MD 12/11/14 [phone]

## 2014-12-03 NOTE — ED Notes (Signed)
Patient c/o migraine, reports hx of same. Tried home meds (Flexeril), not working.

## 2014-12-03 NOTE — Discharge Instructions (Signed)

## 2014-12-03 NOTE — ED Notes (Signed)
Pt c/o migraine that has been constant since yesterday morning. Pt is sensitive to light and has had n/v.

## 2015-07-03 ENCOUNTER — Emergency Department (HOSPITAL_COMMUNITY)
Admission: EM | Admit: 2015-07-03 | Discharge: 2015-07-03 | Disposition: A | Discharge status: Home / Self Care | Payer: Managed Care, Other (non HMO) | Attending: Emergency Medicine | Admitting: Emergency Medicine

## 2015-07-03 ENCOUNTER — Emergency Department (HOSPITAL_COMMUNITY): Payer: Managed Care, Other (non HMO)

## 2015-07-03 ENCOUNTER — Encounter (HOSPITAL_COMMUNITY): Payer: Self-pay | Admitting: Emergency Medicine

## 2015-07-03 DIAGNOSIS — G43909 Migraine, unspecified, not intractable, without status migrainosus: Secondary | ICD-10-CM | POA: Diagnosis not present

## 2015-07-03 DIAGNOSIS — Z79899 Other long term (current) drug therapy: Secondary | ICD-10-CM | POA: Insufficient documentation

## 2015-07-03 DIAGNOSIS — R42 Dizziness and giddiness: Secondary | ICD-10-CM | POA: Insufficient documentation

## 2015-07-03 DIAGNOSIS — R0789 Other chest pain: Secondary | ICD-10-CM | POA: Diagnosis not present

## 2015-07-03 DIAGNOSIS — Z880 Allergy status to penicillin: Secondary | ICD-10-CM | POA: Insufficient documentation

## 2015-07-03 DIAGNOSIS — R079 Chest pain, unspecified: Secondary | ICD-10-CM | POA: Diagnosis present

## 2015-07-03 LAB — I-STAT TROPONIN, ED
TROPONIN I, POC: 0 ng/mL (ref 0.00–0.08)
Troponin i, poc: 0 ng/mL (ref 0.00–0.08)

## 2015-07-03 LAB — CBC
HCT: 41.4 % (ref 36.0–46.0)
Hemoglobin: 13.8 g/dL (ref 12.0–15.0)
MCH: 30.7 pg (ref 26.0–34.0)
MCHC: 33.3 g/dL (ref 30.0–36.0)
MCV: 92 fL (ref 78.0–100.0)
Platelets: 300 10*3/uL (ref 150–400)
RBC: 4.5 MIL/uL (ref 3.87–5.11)
RDW: 13.2 % (ref 11.5–15.5)
WBC: 7 10*3/uL (ref 4.0–10.5)

## 2015-07-03 LAB — BASIC METABOLIC PANEL
ANION GAP: 9 (ref 5–15)
BUN: 19 mg/dL (ref 6–20)
CALCIUM: 9.8 mg/dL (ref 8.9–10.3)
CO2: 25 mmol/L (ref 22–32)
Chloride: 106 mmol/L (ref 101–111)
Creatinine, Ser: 0.77 mg/dL (ref 0.44–1.00)
Glucose, Bld: 97 mg/dL (ref 65–99)
Potassium: 3.7 mmol/L (ref 3.5–5.1)
Sodium: 140 mmol/L (ref 135–145)

## 2015-07-03 MED ORDER — FAMOTIDINE 20 MG PO TABS: 20.0000 mg | ORAL_TABLET | Freq: Two times a day (BID) | ORAL | Status: AC

## 2015-07-03 MED ORDER — ASPIRIN 81 MG PO CHEW
324.0000 mg | CHEWABLE_TABLET | Freq: Once | ORAL | Status: AC
  Administered 2015-07-03: 324 mg via ORAL
  Filled 2015-07-03: qty 4

## 2015-07-03 MED ORDER — GI COCKTAIL ~~LOC~~
30.0000 mL | Freq: Once | ORAL | Status: AC
  Administered 2015-07-03: 30 mL via ORAL
  Filled 2015-07-03: qty 30

## 2015-07-03 MED ORDER — NITROGLYCERIN 2 % TD OINT
1.0000 [in_us] | TOPICAL_OINTMENT | Freq: Once | TRANSDERMAL | Status: AC
  Administered 2015-07-03: 1 [in_us] via TOPICAL
  Filled 2015-07-03: qty 1

## 2015-07-03 NOTE — ED Provider Notes (Signed)
Patient seen/examined in the Emergency Department in conjunction with Midlevel Provider  Patient reports chest pain - reports it as sharp and denies any SOB.  No pleuritic pain.   Exam : awake/alert, no distress, no murmurs noted, lung are clear Plan: HEART score less than 3.  Will have repeat troponin at 530am I doubt PE/Dissection/ACS at this time    Zadie Rhineonald Mateen Franssen, MD 07/03/15 [phone]

## 2015-07-03 NOTE — ED Notes (Signed)
Patient presents with substernal chest pain, onset 45minutes PTA, non radiating, also c/o nausea and lightheadedness. Denies vomiting, diaphoresis. Describes pain as pressure. Rates pain 7/10.

## 2015-07-03 NOTE — ED Provider Notes (Signed)
CSN: [id number]     Arrival date & time 07/03/15  0138 History   First MD Initiated Contact with Patient 07/03/15 0148     CC: Chest pain  (Consider location/radiation/quality/duration/timing/severity/associated sxs/prior Treatment) HPI   Blood pressure 135/81, pulse 70, temperature 98.5 F (36.9 C), temperature source Oral, resp. rate 17, SpO2 100 %.  Shawna Young is a 59 y.o. female complaining of retrosternal pressure-like chest pain onset 1 hour ago when patient was sitting down watching TV associated with lightheadedness. Patient denies diaphoresis, nausea, vomiting, cough, fever, chills, exacerbation with movement, exertion, palpitation, history of diabetes, hypertension, hyperlipidemia, family history of ACS, tobacco use. Patient is status post hysterectomy, she is having hot flashes. Patient had a similar episode in May of this year states that she had a normal stress test at that time. Pt denies fever, cough, h/o DVT/PE, calf pain or leg swelling, hemoptysis, recent immobilization, cancer/chemotherapy in the last 6 months. States that she uses intravaginal estrogen.   Past Medical History  Diagnosis Date  . Migraine    Past Surgical History  Procedure Laterality Date  . Abdominal hysterectomy    . Appendectomy     No family history on file. Social History  Substance Use Topics  . Smoking status: Never Smoker   . Smokeless tobacco: None  . Alcohol Use: 0.6 oz/week    1 Glasses of wine per week     Comment: 1 glass a week   OB History    No data available     Review of Systems  10 systems reviewed and found to be negative, except as noted in the HPI.   Allergies  Toradol; Bactrim; and Penicillins  Home Medications   Prior to Admission medications   Medication Sig Start Date End Date Taking? Authorizing Provider  buPROPion (WELLBUTRIN XL) 300 MG 24 hr tablet Take 300 mg by mouth every morning. 08/15/14 08/15/15 Yes Historical Provider, MD  Estradiol (VAGIFEM) 10  MCG TABS vaginal tablet Place 1 tablet vaginally 2 (two) times a week.    Yes Historical Provider, MD  Multiple Vitamins-Minerals (MULTIVITAMIN GUMMIES ADULT PO) Take 1 each by mouth daily.   Yes Historical Provider, MD  SUMAtriptan (IMITREX) 50 MG tablet Take 1 tablet (50 mg total) by mouth every 2 (two) hours as needed for migraine. May repeat in 2 hours if headache persists or recurs. Do not exceed two doses per day. 12/03/14  Yes Raeford Razor, MD  famotidine (PEPCID) 20 MG tablet Take 1 tablet (20 mg total) by mouth 2 (two) times daily. Patient not taking: Reported on 12/03/2014 10/24/14   Rolan Bucco, MD  fluticasone [facility]) 50 MCG/ACT nasal spray Place 1 spray into the nose daily as needed. allergies 02/18/14 02/18/15  Historical Provider, MD  HYDROcodone-acetaminophen (NORCO/VICODIN) 5-325 MG per tablet Take 1 tablet by mouth every 4 (four) hours as needed. Patient not taking: Reported on 10/24/2014 01/13/14   Rolland Porter, MD  methocarbamol (ROBAXIN) 500 MG tablet Take 1 tablet (500 mg total) by mouth 2 (two) times daily. Patient not taking: Reported on 10/24/2014 01/13/14   Rolland Porter, MD  naproxen (NAPROSYN) 500 MG tablet Take 1 tablet (500 mg total) by mouth 2 (two) times daily. Patient not taking: Reported on 12/03/2014 01/13/14   Rolland Porter, MD  promethazine (PHENERGAN) 25 MG tablet Take 1 tablet (25 mg total) by mouth every 6 (six) hours as needed for nausea. Patient not taking: Reported on 12/03/2014 01/13/14   Derwood Kaplan, MD   BP 135/81  mmHg  Pulse 70  Temp(Src) 98.5 F (36.9 C) (Oral)  Resp 17  SpO2 100% Physical Exam  Constitutional: She is oriented to person, place, and time. She appears well-developed and well-nourished. No distress.  HENT:  Head: Normocephalic.  Mouth/Throat: Oropharynx is clear and moist.  Eyes: Conjunctivae are normal.  Neck: Normal range of motion. No JVD present. No tracheal deviation present.  Cardiovascular: Normal rate, regular rhythm and intact distal  pulses.   Radial pulse equal bilaterally  Pulmonary/Chest: Effort normal and breath sounds normal. No stridor. No respiratory distress. She has no wheezes. She has no rales. She exhibits no tenderness.  Abdominal: Soft. She exhibits no distension and no mass. There is no tenderness. There is no rebound and no guarding.  Musculoskeletal: Normal range of motion. She exhibits no edema or tenderness.  No calf asymmetry, superficial collaterals, palpable cords, edema, Homans sign negative bilaterally.    Neurological: She is alert and oriented to person, place, and time.  Skin: Skin is warm. She is not diaphoretic.  Psychiatric: She has a normal mood and affect.  Nursing note and vitals reviewed.   ED Course  Procedures (including critical care time) Labs Review Labs Reviewed  BASIC METABOLIC PANEL  CBC  I-STAT TROPOININ, ED    Imaging Review Dg Chest 2 View  07/03/2015  CLINICAL DATA:  59 year old female with sudden onset of chest pain EXAM: CHEST  2 VIEW COMPARISON:  Chest radiograph dated 10/24/2014 FINDINGS: The heart size and mediastinal contours are within normal limits. Both lungs are clear. The visualized skeletal structures are unremarkable. IMPRESSION: No active cardiopulmonary disease. Electronically Signed   By: Elgie CollardArash  Radparvar M.D.   On: 07/03/2015 02:17   I have personally reviewed and evaluated these images and lab results as part of my medical decision-making.   EKG Interpretation   Date/Time:  Friday July 03 2015 01:52:11 EST Ventricular Rate:  71 PR Interval:  163 QRS Duration: 85 QT Interval:  379 QTC Calculation: 412 R Axis:   73 Text Interpretation:  Sinus rhythm Non-specific ST-t changes No  significant change since last tracing Confirmed by Bebe ShaggyWICKLINE  MD, DONALD  [phone]([id number]) on 07/03/2015 1:58:03 AM      MDM   Final diagnoses:  Atypical chest pain    Filed Vitals:   07/03/15 0155 07/03/15 0241  BP: 135/81   Pulse: 70   Temp: 98.5 F (36.9 C)    TempSrc: Oral   Resp: 17   SpO2: 100% 100%    Medications  gi cocktail (Maalox,Lidocaine,Donnatal) (not administered)  aspirin chewable tablet 324 mg (324 mg Oral Given 07/03/15 0210)  nitroGLYCERIN (NITROGLYN) 2 % ointment 1 inch (1 inch Topical Given 07/03/15 0210)    Shawna Young is 59 y.o. female presenting with retrosternal nonradiating chest pain associated with lightheaded sensation while at rest. Patient is low risk by heart score. Had negative stress test within the last year. EKG unchanged from prior.  Trop negative.   Pain improved with GI cocktail, I think this may be reflux. Delta troponin is negative. Patient will follow with primary care physician, will start on Pepcid.  This is a shared visit with the attending physician who personally evaluated the patient and agrees with the care plan.    Evaluation does not show pathology that would require ongoing emergent intervention or inpatient treatment. Pt is hemodynamically stable and mentating appropriately. Discussed findings and plan with patient/guardian, who agrees with care plan. All questions answered. Return precautions discussed and outpatient follow up  given.   New Prescriptions   FAMOTIDINE (PEPCID) 20 MG TABLET    Take 1 tablet (20 mg total) by mouth 2 (two) times daily.         Wynetta Emery, PA-C 07/03/15 1610  Zadie Rhine, MD 07/03/15 [phone]

## 2015-07-03 NOTE — ED Notes (Signed)
EKG given to EDP,Wickline,MD., for review. 

## 2015-07-03 NOTE — Discharge Instructions (Signed)
Please follow with your primary care doctor in the next 2 days for a check-up. They must obtain records for further management.  ° °Do not hesitate to return to the Emergency Department for any new, worsening or concerning symptoms.  ° °

## 2020-10-10 ENCOUNTER — Other Ambulatory Visit: Payer: Self-pay

## 2020-10-10 ENCOUNTER — Emergency Department (HOSPITAL_COMMUNITY)
Admission: EM | Admit: 2020-10-10 | Discharge: 2020-10-10 | Disposition: A | Discharge status: Home / Self Care | Payer: Medicare Other | Attending: Emergency Medicine | Admitting: Emergency Medicine

## 2020-10-10 ENCOUNTER — Encounter (HOSPITAL_COMMUNITY): Payer: Self-pay

## 2020-10-10 ENCOUNTER — Emergency Department (HOSPITAL_COMMUNITY): Payer: Medicare Other

## 2020-10-10 DIAGNOSIS — M79661 Pain in right lower leg: Secondary | ICD-10-CM | POA: Insufficient documentation

## 2020-10-10 DIAGNOSIS — R0602 Shortness of breath: Secondary | ICD-10-CM | POA: Diagnosis not present

## 2020-10-10 DIAGNOSIS — R0789 Other chest pain: Secondary | ICD-10-CM

## 2020-10-10 DIAGNOSIS — R079 Chest pain, unspecified: Secondary | ICD-10-CM | POA: Insufficient documentation

## 2020-10-10 LAB — COMPREHENSIVE METABOLIC PANEL
ALT: 22 U/L (ref 0–44)
AST: 26 U/L (ref 15–41)
Albumin: 4.4 g/dL (ref 3.5–5.0)
Alkaline Phosphatase: 72 U/L (ref 38–126)
Anion gap: 6 (ref 5–15)
BUN: 15 mg/dL (ref 8–23)
CO2: 25 mmol/L (ref 22–32)
Calcium: 9.9 mg/dL (ref 8.9–10.3)
Chloride: 107 mmol/L (ref 98–111)
Creatinine, Ser: 0.77 mg/dL (ref 0.44–1.00)
GFR, Estimated: >60 mL/min (ref >60)
Glucose, Bld: 100 mg/dL — ABNORMAL HIGH (ref 70–99)
Potassium: 3.7 mmol/L (ref 3.5–5.1)
Sodium: 138 mmol/L (ref 135–145)
Total Bilirubin: 0.7 mg/dL (ref 0.3–1.2)
Total Protein: 7.2 g/dL (ref 6.5–8.1)

## 2020-10-10 LAB — CBC WITH DIFFERENTIAL/PLATELET
Abs Immature Granulocytes: 0.02 10*3/uL (ref 0.00–0.07)
Basophils Absolute: 0.1 10*3/uL (ref 0.0–0.1)
Basophils Relative: 1 %
Eosinophils Absolute: 0.2 10*3/uL (ref 0.0–0.5)
Eosinophils Relative: 3 %
HCT: 43.3 % (ref 36.0–46.0)
Hemoglobin: 14.5 g/dL (ref 12.0–15.0)
Immature Granulocytes: 0 %
Lymphocytes Relative: 36 %
Lymphs Abs: 3 10*3/uL (ref 0.7–4.0)
MCH: 30.7 pg (ref 26.0–34.0)
MCHC: 33.5 g/dL (ref 30.0–36.0)
MCV: 91.7 fL (ref 80.0–100.0)
Monocytes Absolute: 0.8 10*3/uL (ref 0.1–1.0)
Monocytes Relative: 10 %
Neutro Abs: 4.3 10*3/uL (ref 1.7–7.7)
Neutrophils Relative %: 50 %
Platelets: 315 10*3/uL (ref 150–400)
RBC: 4.72 MIL/uL (ref 3.87–5.11)
RDW: 13.2 % (ref 11.5–15.5)
WBC: 8.4 10*3/uL (ref 4.0–10.5)
nRBC: 0 % (ref 0.0–0.2)

## 2020-10-10 LAB — TROPONIN I (HIGH SENSITIVITY)
Troponin I (High Sensitivity): 3 ng/L (ref <18)
Troponin I (High Sensitivity): 3 ng/L (ref <18)

## 2020-10-10 MED ORDER — IOHEXOL 350 MG/ML SOLN
75.0000 mL | Freq: Once | INTRAVENOUS | Status: AC | PRN
  Administered 2020-10-10: 75 mL via INTRAVENOUS

## 2020-10-10 NOTE — ED Notes (Signed)
Returned from CT.

## 2020-10-10 NOTE — ED Notes (Signed)
ED Provider at bedside. 

## 2020-10-10 NOTE — ED Notes (Signed)
I spoke with lab regarding troponin that was sent at 1610, lab states is spinning now

## 2020-10-10 NOTE — ED Provider Notes (Signed)
I assumed care of patient at shift change from previous team, please see their note for full H&P. Briefly patient developed sharp chest pain radiating into the left jaw and between shoulder blades and recently had a prolonged trip to [place]. Physical Exam  BP 124/80   Pulse 66   Temp 97.7 F (36.5 C) (Oral)   Resp 18   Ht 5\' 6"  (1.676 m)   Wt 77.1 kg   SpO2 99%   BMI 27.44 kg/m   Physical Exam Patient is awake and alert, lying in bed in no obvious distress.  No tenderness to palpation of the chest wall or back.  2+ radial pulses present bilaterally.  No posterior calf pain or tenderness bilaterally on my exam.  Respirations are even and unlabored.  ED Course/Procedures     Procedures   Labs Reviewed  COMPREHENSIVE METABOLIC PANEL - Abnormal; Notable for the following components:      Result Value   Glucose, Bld 100 (*)    All other components within normal limits  CBC WITH DIFFERENTIAL/PLATELET  TROPONIN I (HIGH SENSITIVITY)  TROPONIN I (HIGH SENSITIVITY)    CT Angio Chest PE W and/or Wo Contrast  Result Date: 10/10/2020 CLINICAL DATA:  Acute onset pleuritic chest pain and shortness of breath. EXAM: CT ANGIOGRAPHY CHEST WITH CONTRAST TECHNIQUE: Multidetector CT imaging of the chest was performed using the standard protocol during bolus administration of intravenous contrast. Multiplanar CT image reconstructions and MIPs were obtained to evaluate the vascular anatomy. CONTRAST:  57mL OMNIPAQUE IOHEXOL 350 MG/ML SOLN COMPARISON:  Chest x-ray dated July 03, 2015. FINDINGS: Cardiovascular: Satisfactory opacification of the pulmonary arteries to the segmental level. No evidence of pulmonary embolism. Normal heart size. No pericardial effusion. No thoracic aortic aneurysm or dissection. Mediastinum/Nodes: No enlarged mediastinal, hilar, or axillary lymph nodes. Thyroid gland, trachea, and esophagus demonstrate no significant findings. Lungs/Pleura: No focal consolidation, pleural  effusion, or pneumothorax. No suspicious pulmonary nodule. Upper Abdomen: No acute abnormality. Proximal stenosis of the celiac artery. Musculoskeletal: No chest wall abnormality. No acute or significant osseous findings. Review of the MIP images confirms the above findings. IMPRESSION: 1. No evidence of pulmonary embolism. No acute intrathoracic process. 2. 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 Electronically Signed   By: July 05, 2015 M.D.   On: 10/10/2020 17:07     MDM  Plan is to follow-up on troponin, CT PE study, if normal anticipate discharge home.  Overall low suspicion for DVT.  CTA PE study does not show PE or other cause for patient's symptoms. Her troponin x2 is negative. Both myself and Dr. 12/10/2020 had a discussion with patient regarding admission for ongoing chest pain for further evaluation versus discharge home.  Through shared decision making patient will discharge home.  She is instructed to take aspirin 81 mg daily.  She is given cardiology follow-up and strict return precautions.  Return precautions were discussed with patient who states their understanding.  At the time of discharge patient denied any unaddressed complaints or concerns.  Patient is agreeable for discharge home.  Note: Portions of this report may have been transcribed using voice recognition software. Every effort was made to ensure accuracy; however, inadvertent computerized transcription errors may be present        Silverio Lay 10/10/20 2338    2339, MD 10/10/20 [phone]

## 2020-10-10 NOTE — ED Provider Notes (Signed)
[facility] EMERGENCY DEPARTMENT Provider Note   CSN: [id number] Arrival date & time: 10/10/20  1346     History Chief Complaint  Patient presents with  . Shortness of Breath  . Chest Pain    Started one hour ago, sent here from urgent care, cp sharp radiates to left jaw and between shoulder blades, nausea and sob    Shawna Young is a 65 y.o. female.  Patient presents the emergency department for evaluation of chest pain, left-sided, worse with deep breathing and sharp in nature, and shortness of breath.  Approximately 2 hours ago patient was putting away dishes and developed acute pain in this area.  She went to an urgent care who did an EKG which was reportedly unremarkable.  She was sent to the emergency department due to concern for pulmonary embolism.  Patient recently drove to and from [place], stopping once on the way.  She reports soreness in her right anterior lower leg that she noted during urgent care exam.  Patient denies history of any heart problems.  No risk factors including hypertension, high cholesterol, diabetes.  She does not use tobacco.  No abdominal pain.  No nausea or vomiting.  Patient denies risk factors for pulmonary embolism including: unilateral leg swelling, history of DVT/PE/other blood clots, use of exogenous hormones, recent immobilizations, recent surgery, recent travel (>4hr segment), malignancy, hemoptysis.          Past Medical History:  Diagnosis Date  . Migraine     There are no problems to display for this patient.   Past Surgical History:  Procedure Laterality Date  . ABDOMINAL HYSTERECTOMY    . APPENDECTOMY       OB History    Gravida  2   Para  2   Term      Preterm      AB      Living        SAB      IAB      Ectopic      Multiple      Live Births              No family history on file.  Social History   Tobacco Use  . Smoking status: Never Smoker  . Smokeless tobacco: Never Used   Substance Use Topics  . Drug use: No    Home Medications Prior to Admission medications   Medication Sig Start Date End Date Taking? Authorizing Provider  buPROPion (WELLBUTRIN XL) 300 MG 24 hr tablet Take 300 mg by mouth every morning. 08/15/14 08/15/15  [provider]  Estradiol (VAGIFEM) 10 MCG TABS vaginal tablet Place 1 tablet vaginally 2 (two) times a week.     [provider]  famotidine (PEPCID) 20 MG tablet Take 1 tablet (20 mg total) by mouth 2 (two) times daily. 07/03/15   Pisciotta, Joni Reining, PA-C  fluticasone (FLONASE) 50 MCG/ACT nasal spray Place 1 spray into the nose daily as needed. allergies 02/18/14 02/18/15  [provider]  HYDROcodone-acetaminophen (NORCO/VICODIN) 5-325 MG per tablet Take 1 tablet by mouth every 4 (four) hours as needed. Patient not taking: Reported on 10/24/2014 01/13/14   Rolland Porter, MD  methocarbamol (ROBAXIN) 500 MG tablet Take 1 tablet (500 mg total) by mouth 2 (two) times daily. Patient not taking: Reported on 10/24/2014 01/13/14   Rolland Porter, MD  Multiple Vitamins-Minerals (MULTIVITAMIN GUMMIES ADULT PO) Take 1 each by mouth daily.    [provider]  naproxen (NAPROSYN) 500 MG tablet Take 1 tablet (500 mg total) by mouth 2 (two) times daily. Patient not taking: Reported on 12/03/2014 01/13/14   Rolland Porter, MD  promethazine (PHENERGAN) 25 MG tablet Take 1 tablet (25 mg total) by mouth every 6 (six) hours as needed for nausea. Patient not taking: Reported on 12/03/2014 01/13/14   Derwood Kaplan, MD  SUMAtriptan (IMITREX) 50 MG tablet Take 1 tablet (50 mg total) by mouth every 2 (two) hours as needed for migraine. May repeat in 2 hours if headache persists or recurs. Do not exceed two doses per day. 12/03/14   Raeford Razor, MD    Allergies    Toradol [ketorolac tromethamine], Bactrim [sulfamethoxazole-trimethoprim], and Penicillins  Review of Systems   Review of Systems  Constitutional: Negative for diaphoresis and  fever.  Eyes: Negative for redness.  Respiratory: Positive for shortness of breath. Negative for cough.   Cardiovascular: Positive for chest pain. Negative for palpitations and leg swelling.  Gastrointestinal: Positive for nausea. Negative for abdominal pain and vomiting.  Genitourinary: Negative for dysuria.  Musculoskeletal: Positive for back pain. Negative for neck pain.  Skin: Negative for rash.  Neurological: Negative for syncope and light-headedness.  Psychiatric/Behavioral: The patient is not nervous/anxious.     Physical Exam Updated Vital Signs BP (!) 128/91 (BP Location: Right Arm)   Pulse 75 Comment: NSR  Temp 98.3 F (36.8 C) (Oral)   Resp 18   Ht 5\' 6"  (1.676 m)   Wt 77.1 kg   SpO2 100%   BMI 27.44 kg/m   Physical Exam Vitals and nursing note reviewed.  Constitutional:      Appearance: She is well-developed. She is not diaphoretic.  HENT:     Head: Normocephalic and atraumatic.     Mouth/Throat:     Mouth: Mucous membranes are not dry.  Eyes:     Conjunctiva/sclera: Conjunctivae normal.  Neck:     Vascular: Normal carotid pulses. No carotid bruit or JVD.     Trachea: Trachea normal. No tracheal deviation.  Cardiovascular:     Rate and Rhythm: Normal rate and regular rhythm.     Pulses: No decreased pulses.     Heart sounds: Normal heart sounds, S1 normal and S2 normal. No murmur heard.     Comments: 2+ radial pulses bilaterally Pulmonary:     Effort: Pulmonary effort is normal. No respiratory distress.     Breath sounds: No wheezing.     Comments: Good air movement Chest:     Chest wall: No tenderness.  Abdominal:     General: Bowel sounds are normal.     Palpations: Abdomen is soft.     Tenderness: There is no abdominal tenderness. There is no guarding or rebound.  Musculoskeletal:        General: Normal range of motion.     Cervical back: Normal range of motion and neck supple. No muscular tenderness.     Right lower leg: Tenderness (anterior  shin, no skin change) present. No edema.     Left lower leg: No tenderness. No edema.  Skin:    General: Skin is warm and dry.     Coloration: Skin is not pale.  Neurological:     Mental Status: She is alert.     ED Results / Procedures / Treatments   Labs (all labs ordered are listed, but only abnormal results are displayed) Labs Reviewed  CBC WITH DIFFERENTIAL/PLATELET  COMPREHENSIVE METABOLIC PANEL  TROPONIN I (HIGH SENSITIVITY)  ED ECG REPORT   Date: 10/10/2020  Rate: 76  Rhythm: normal sinus rhythm  QRS Axis: normal  Intervals: normal  ST/T Wave abnormalities: normal  Conduction Disutrbances:none  Narrative Interpretation:   Old EKG Reviewed: unchanged  I have personally reviewed the EKG tracing and agree with the computerized printout as noted.  Radiology No results found.  Procedures Procedures   Medications Ordered in ED Medications - No data to display  ED Course  I have reviewed the triage vital signs and the nursing notes.  Pertinent labs & imaging results that were available during my care of the patient were reviewed by me and considered in my medical decision making (see chart for details).  Patient seen and examined. Work-up initiated. She declines pain medication. Will obtain CT given nature of pain and recent travel history. EKG reviewed.   Vital signs reviewed and are as follows: BP (!) 128/91 (BP Location: Right Arm)   Pulse 75 Comment: NSR  Temp 98.3 F (36.8 C) (Oral)   Resp 18   Ht 5\' 6"  (1.676 m)   Wt 77.1 kg   SpO2 100%   BMI 27.44 kg/m   2:59 PM Signout to at shift change.     MDM Rules/Calculators/A&P                          Pending CP eval, r/o PE.   Final Clinical Impression(s) / ED Diagnoses Final diagnoses:  None    Rx / DC Orders ED Discharge Orders    None       [employer], PA-C 10/10/20 1500    12/10/20, MD 10/11/20 [phone]

## 2020-10-10 NOTE — ED Triage Notes (Signed)
Started one hour ago, sent from urgent care, see addl triage notes

## 2020-10-10 NOTE — Discharge Instructions (Signed)
If your pain changes, worsens, or you develop any other new or concerning symptoms please seek additional medical care and evaluation. Please schedule a follow-up appointment with both your primary care doctor and cardiology.

## 2020-10-10 NOTE — ED Notes (Signed)
Pt discharged and ambulated out of the ED without difficulty. 

## 2021-06-13 IMAGING — CT CT ANGIO CHEST
2 of 7 series · 19 of 46 positions shown · IV contrast (omnipaque)
Comparison: Chest x-ray dated July 03, 2015.

CLINICAL DATA: Acute onset pleuritic chest pain and shortness of
breath.

EXAM:
CT ANGIOGRAPHY CHEST WITH CONTRAST
TECHNIQUE: Multidetector CT imaging of the chest was performed using the
standard protocol during bolus administration of intravenous
contrast. Multiplanar CT image reconstructions and MIPs were
obtained to evaluate the vascular anatomy.
CONTRAST:  75mL OMNIPAQUE IOHEXOL 350 MG/ML SOLN

[Series 6: thins · axial · 0.68mm/px · z∈[+969,+1237]mm · 16 of 431 slices shown]
[im 24/431  lung]
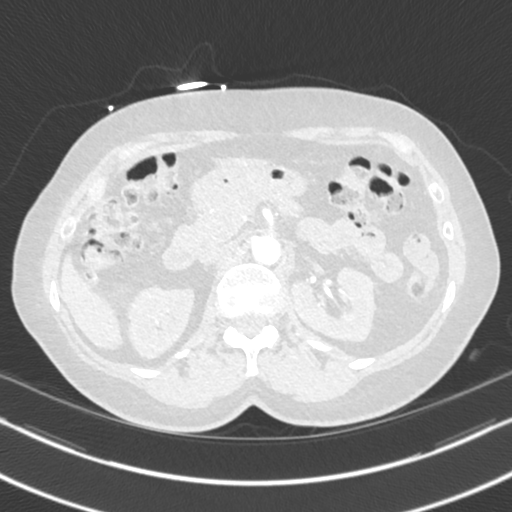
[im 48/431  soft-tissue]
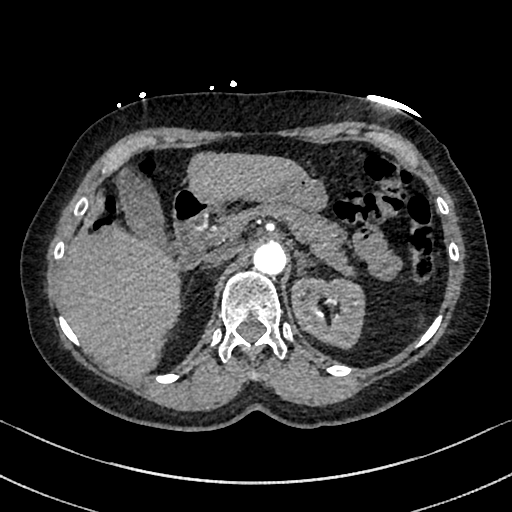
[im 72/431  lung]
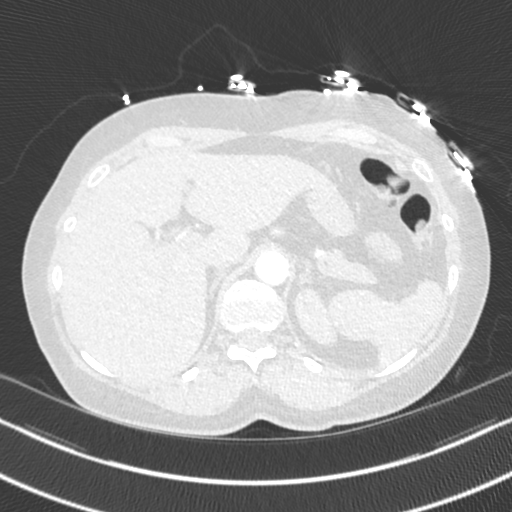
[im 96/431  soft-tissue]
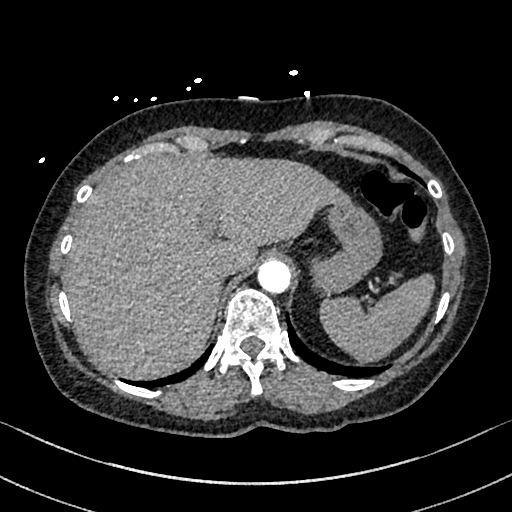
[im 120/431  lung]
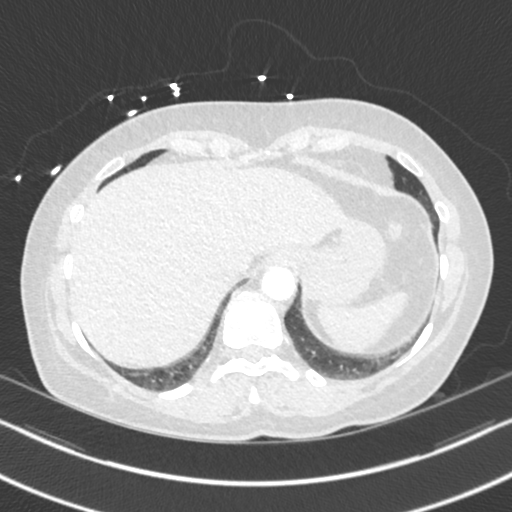
[im 144/431  soft-tissue]
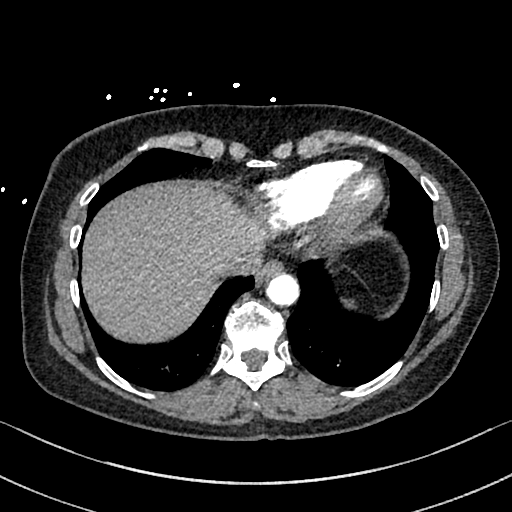
[im 168/431  lung]
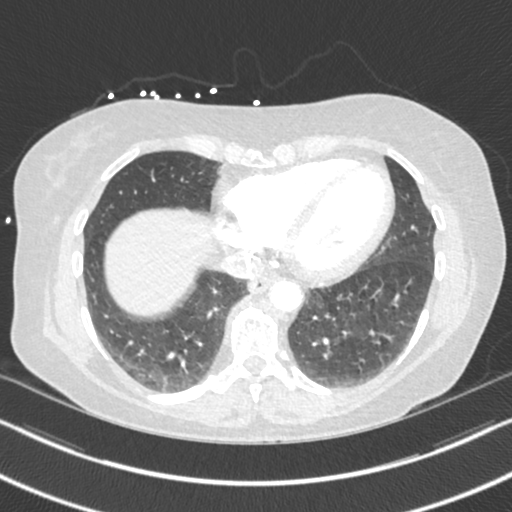
[im 192/431  soft-tissue]
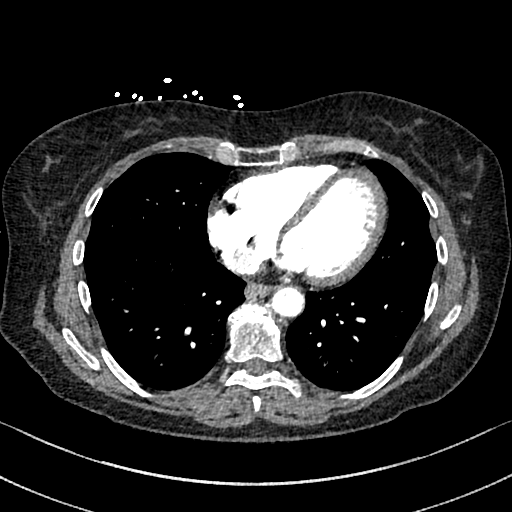
[im 239/431  lung]
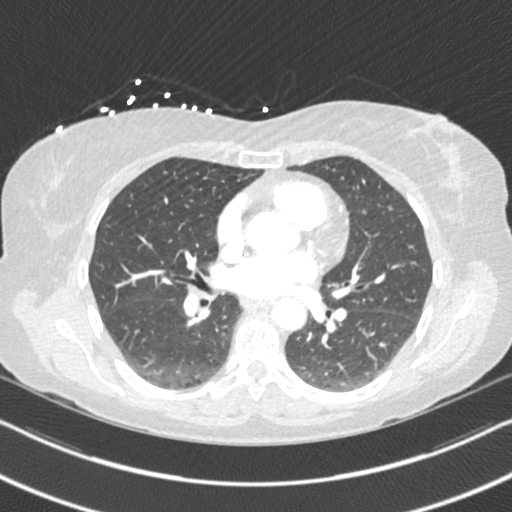
[im 263/431  soft-tissue]
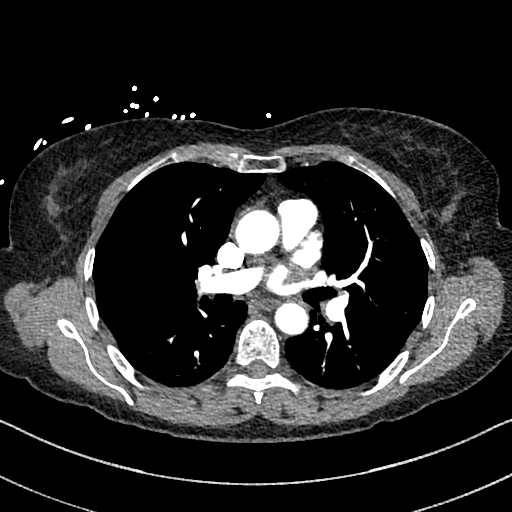
[im 287/431  lung]
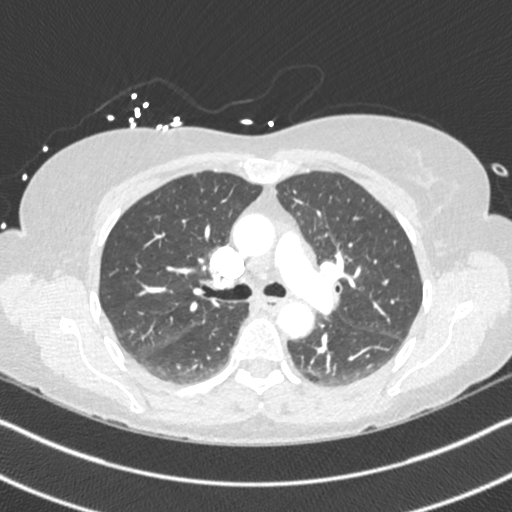
[im 311/431  soft-tissue]
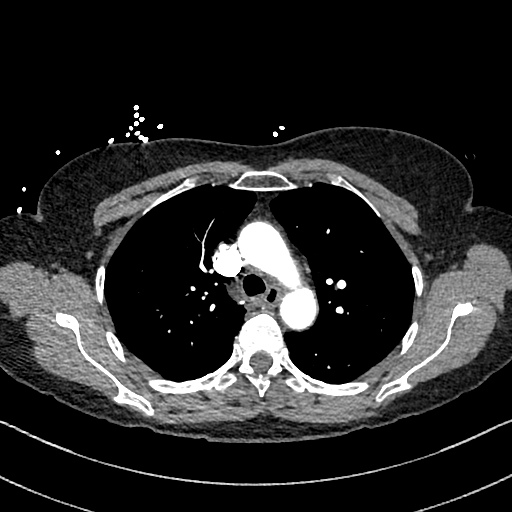
[im 335/431  lung]
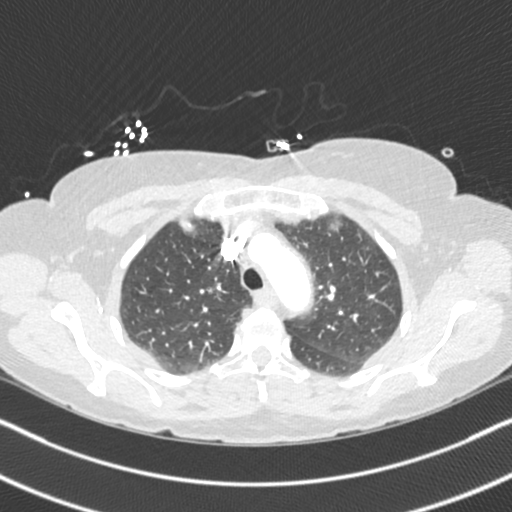
[im 359/431  soft-tissue]
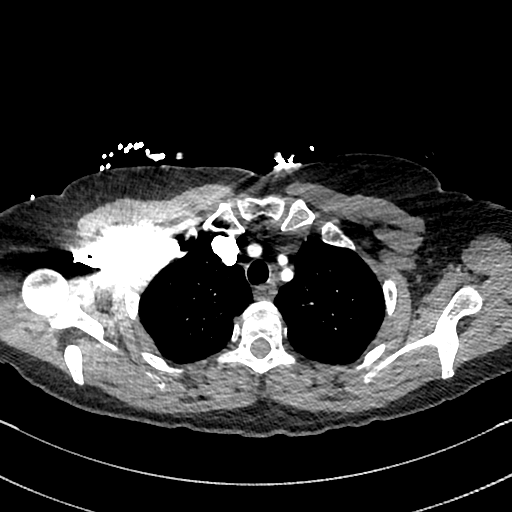
[im 383/431  lung]
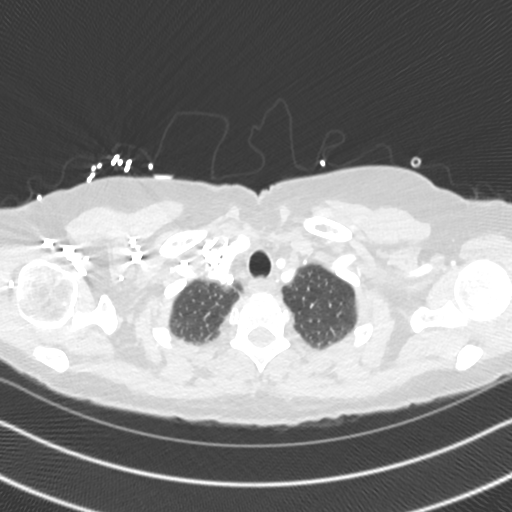
[im 407/431  soft-tissue]
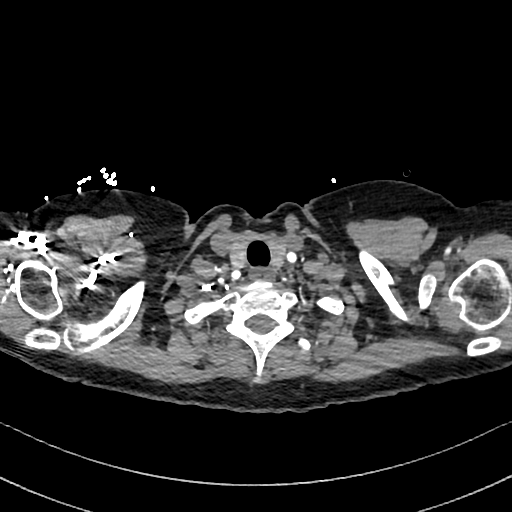

[Series 8: cor · coronal · 0.69mm/px · 3 of 128 slices shown]
[im 32/128  soft-tissue]
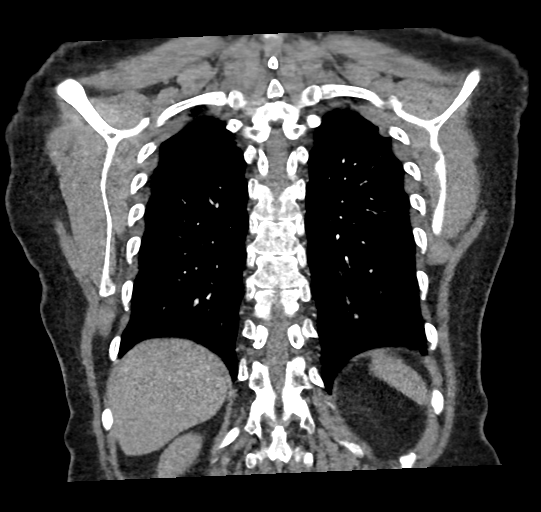
[im 64/128  soft-tissue]
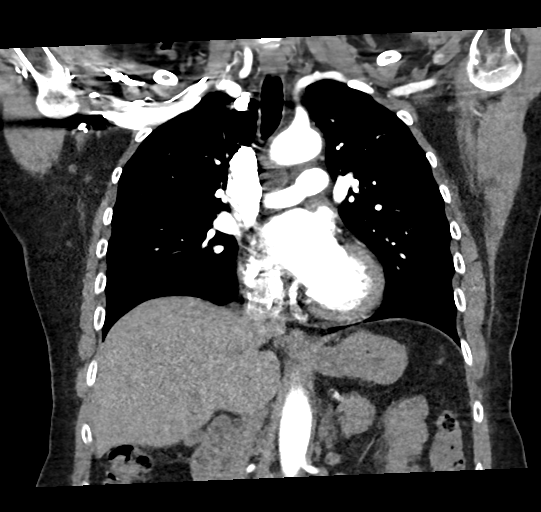
[im 96/128  soft-tissue]
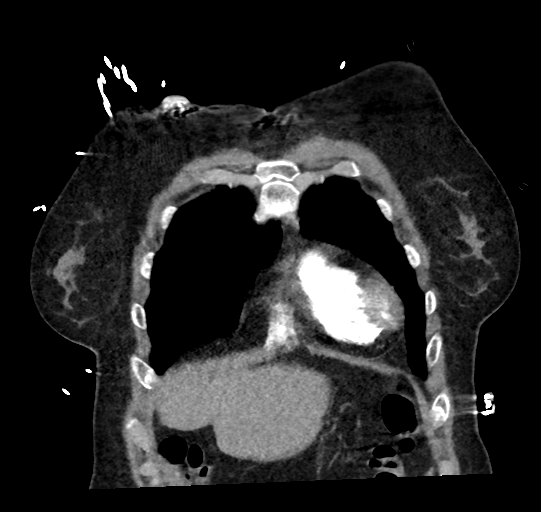

[19 of 46 positions shown; findings below may reference images not displayed]

FINDINGS: Cardiovascular: Satisfactory opacification of the pulmonary arteries
to the segmental level. No evidence of pulmonary embolism. Normal
heart size. No pericardial effusion. No thoracic aortic aneurysm or
dissection.

Mediastinum/Nodes: No enlarged mediastinal, hilar, or axillary lymph
nodes. Thyroid gland, trachea, and esophagus demonstrate no
significant findings.

Lungs/Pleura: No focal consolidation, pleural effusion, or
pneumothorax. No suspicious pulmonary nodule.

Upper Abdomen: No acute abnormality. Proximal stenosis of the celiac
artery.

Musculoskeletal: No chest wall abnormality. No acute or significant
osseous findings.

Review of the MIP images confirms the above findings.
IMPRESSION: 1. No evidence of pulmonary embolism. No acute intrathoracic
process.
2. Proximal stenosis of the celiac artery. Correlate for median
arcuate ligament syndrome.
